# Patient Record
Sex: Male | Born: 1980 | Race: Black or African American | Hispanic: No | Marital: Single | Smoking: Never smoker
Health system: Southern US, Community
[De-identification: ages and names within clinical notes are randomized; demographics above are authoritative.]

## PROBLEM LIST (undated history)

## (undated) HISTORY — PX: HERNIA REPAIR: SHX51

## (undated) HISTORY — PX: OTHER SURGICAL HISTORY: SHX169

---

## 2005-11-13 ENCOUNTER — Ambulatory Visit (HOSPITAL_BASED_OUTPATIENT_CLINIC_OR_DEPARTMENT_OTHER): Admission: RE | Admit: 2005-11-13 | Discharge: 2005-11-13 | Payer: Self-pay | Admitting: Surgery

## 2005-12-20 ENCOUNTER — Ambulatory Visit (HOSPITAL_BASED_OUTPATIENT_CLINIC_OR_DEPARTMENT_OTHER): Admission: RE | Admit: 2005-12-20 | Discharge: 2005-12-20 | Payer: Self-pay | Admitting: Surgery

## 2006-07-28 ENCOUNTER — Encounter: Admission: RE | Admit: 2006-07-28 | Discharge: 2006-07-28 | Payer: Self-pay | Admitting: Surgery

## 2007-01-29 ENCOUNTER — Ambulatory Visit (HOSPITAL_BASED_OUTPATIENT_CLINIC_OR_DEPARTMENT_OTHER): Admission: RE | Admit: 2007-01-29 | Discharge: 2007-01-29 | Payer: Self-pay | Admitting: Surgery

## 2007-03-13 ENCOUNTER — Emergency Department (HOSPITAL_COMMUNITY): Admission: EM | Admit: 2007-03-13 | Discharge: 2007-03-13 | Payer: Self-pay | Admitting: Emergency Medicine

## 2007-05-10 ENCOUNTER — Emergency Department (HOSPITAL_COMMUNITY): Admission: EM | Admit: 2007-05-10 | Discharge: 2007-05-11 | Payer: Self-pay | Admitting: Emergency Medicine

## 2007-06-02 ENCOUNTER — Ambulatory Visit (HOSPITAL_COMMUNITY): Admission: RE | Admit: 2007-06-02 | Discharge: 2007-06-02 | Payer: Self-pay | Admitting: Surgery

## 2007-12-16 ENCOUNTER — Encounter: Payer: Self-pay | Admitting: Gastroenterology

## 2007-12-21 ENCOUNTER — Ambulatory Visit: Payer: Self-pay | Admitting: Gastroenterology

## 2007-12-21 DIAGNOSIS — K279 Peptic ulcer, site unspecified, unspecified as acute or chronic, without hemorrhage or perforation: Secondary | ICD-10-CM | POA: Insufficient documentation

## 2007-12-21 DIAGNOSIS — K210 Gastro-esophageal reflux disease with esophagitis: Secondary | ICD-10-CM

## 2007-12-21 DIAGNOSIS — R634 Abnormal weight loss: Secondary | ICD-10-CM

## 2007-12-21 DIAGNOSIS — R1033 Periumbilical pain: Secondary | ICD-10-CM | POA: Insufficient documentation

## 2007-12-23 ENCOUNTER — Ambulatory Visit: Payer: Self-pay | Admitting: Internal Medicine

## 2007-12-23 ENCOUNTER — Telehealth (INDEPENDENT_AMBULATORY_CARE_PROVIDER_SITE_OTHER): Payer: Self-pay | Admitting: *Deleted

## 2008-01-06 ENCOUNTER — Ambulatory Visit: Payer: Self-pay | Admitting: Gastroenterology

## 2008-01-08 ENCOUNTER — Ambulatory Visit: Payer: Self-pay | Admitting: Gastroenterology

## 2008-01-08 DIAGNOSIS — K3189 Other diseases of stomach and duodenum: Secondary | ICD-10-CM

## 2008-01-08 DIAGNOSIS — R1013 Epigastric pain: Secondary | ICD-10-CM

## 2008-01-08 DIAGNOSIS — R1319 Other dysphagia: Secondary | ICD-10-CM | POA: Insufficient documentation

## 2008-01-08 LAB — CONVERTED CEMR LAB
Albumin: 4.3 g/dL (ref 3.5–5.2)
Bilirubin, Direct: 0.1 mg/dL (ref 0.0–0.3)
CO2: 29 meq/L (ref 19–32)
Chloride: 101 meq/L (ref 96–112)
Creatinine, Ser: 1.3 mg/dL (ref 0.4–1.5)
Eosinophils Relative: 2.2 % (ref 0.0–5.0)
GFR calc non Af Amer: 69 mL/min
Glucose, Bld: 105 mg/dL — ABNORMAL HIGH (ref 70–99)
HCT: 49 % (ref 39.0–52.0)
MCHC: 34.6 g/dL (ref 30.0–36.0)
Monocytes Absolute: 0.6 10*3/uL (ref 0.1–1.0)
Monocytes Relative: 10.1 % (ref 3.0–12.0)
Neutrophils Relative %: 53.7 % (ref 43.0–77.0)
Platelets: 164 10*3/uL (ref 150–400)
Potassium: 4.1 meq/L (ref 3.5–5.1)
Sodium: 138 meq/L (ref 135–145)
Total Bilirubin: 1.3 mg/dL — ABNORMAL HIGH (ref 0.3–1.2)
WBC: 5.6 10*3/uL (ref 4.5–10.5)

## 2008-01-20 ENCOUNTER — Encounter: Payer: Self-pay | Admitting: Gastroenterology

## 2008-01-20 ENCOUNTER — Ambulatory Visit: Payer: Self-pay | Admitting: Gastroenterology

## 2008-01-25 ENCOUNTER — Encounter: Payer: Self-pay | Admitting: Gastroenterology

## 2010-10-09 NOTE — Op Note (Signed)
Cole [name], Cole [name]                ACCOUNT NO.:  [id number]   MEDICAL RECORD NO.:  [id number]          PATIENT TYPE:  AMB   LOCATION:  DSC                          FACILITY:  [facility]   PHYSICIAN:  Wilmon Arms. Corliss Skains, M.D. DATE OF BIRTH:  1980/12/12   DATE OF PROCEDURE:  01/29/2007  DATE OF DISCHARGE:                               OPERATIVE REPORT   PREOPERATIVE DIAGNOSIS:  Persistent periumbilical pain.   POSTOPERATIVE DIAGNOSIS:  Persistent periumbilical pain.   PROCEDURE PERFORMED:  Umbilical wound exploration.   SURGEON:  Wilmon Arms. Tsuei, M.D.,FACS   ANESTHESIA:  General.   INDICATIONS:  The patient is a 30 year old male who is status post  umbilical hernia repair December 20, 2005.  He presented recently with  complaints of persistent pain on the right side of his umbilicus.  This  is very well localized and likely represents a permanent suture holding  his mesh in place.  We offered him umbilical wound exploration with  removal of the permanent suture.   DESCRIPTION OF PROCEDURE:  The patient is brought to the operating room,  placed in the supine position on the operating table.  After an adequate  level of general anesthesia was obtained, the periumbilical region was  shaved, prepped with Betadine and draped in a sterile fashion.  We had  previously marked the area of point tenderness.  His previous incision  was infiltrated with 10 cc of 0.25% Marcaine with epinephrine.  The  incision was opened.  Cautery was used to dissect down through some of  the scar tissue down to the fascia.  We removed a total of three Novofil  sutures around the area of tenderness.  Two of these represented the  Novofil sutures holding the mesh in place on the right.  The third  suture was likely the fascial closure.  The fascial closure suture was  replaced with a 0 PDS suture.  The plan was to use an absorbable suture  which would dissolve over time.  The mesh was felt to be intact.  I did  not  make a defect large enough to pass a finger into the preperitoneal  space.  It seemed that we had removed all the sutures that we had been  looking for.  The wound was then closed with a deep layer of 3-0 Vicryl  and a subcuticular of 4-0 Monocryl.  Steri-Strips and clean dressings  were applied.  The patient was extubated and brought to recovery in  stable condition.  All sponge, instrument and needle counts were  correct.      Wilmon Arms. Tsuei, M.D.  Electronically Signed     MKT/MEDQ  D:  01/29/2007  T:  01/29/2007  Job:  [id number]

## 2010-10-09 NOTE — Op Note (Signed)
[name], Cole                ACCOUNT NO.:  [id number]   MEDICAL RECORD NO.:  [id number]          PATIENT TYPE:  AMB   LOCATION:  SDS                          FACILITY:  [facility]   PHYSICIAN:  Wilmon Arms. Corliss Skains, M.D. DATE OF BIRTH:  [birth year]   DATE OF PROCEDURE:  06/02/2007  DATE OF DISCHARGE:                               OPERATIVE REPORT   PREOPERATIVE DIAGNOSIS:  Recurrent umbilical hernia.   POSTOPERATIVE DIAGNOSIS:  Umbilical pain.   SURGEON:  Wilmon Arms. Corliss Skains, M.D., FACS   PROCEDURE:  1. Diagnostic laparoscopy.  2. Umbilical exploration.   ANESTHESIA:  General.   INDICATIONS:  The patient is a 30 year old male who is status post  umbilical hernia repair.  The patient has had a lot of problems with  periumbilical pain.  Last September he underwent open exploration with  removal of one of the Novofil sutures.  However, he continues to have  persistent pain.  A CT scan performed at [facility] showed  what appeared to be some incarcerated fat in the preperitoneal space.  It was felt that some omentum had crept around the side of his mesh.  I  offered the patient diagnostic laparoscopy to see if we could reduce  this incarcerated fat.   DESCRIPTION OF PROCEDURE:  The patient brought to the operating room,  placed in the supine position on operating table with his left arm  tucked.  After an adequate level of general anesthesia was obtained, the  patient's abdomen was shaved, prepped with Betadine and draped in  sterile fashion.  Time-out was taken assure proper patient, proper  procedure.  Below the left costal margin, we infiltrated the area with  0.25% Marcaine.  I made a 11 mm incision.  The 11-mm OptiVu trocar was  advanced into the peritoneal cavity.  We insufflated with CO2  maintaining maximal pressure of 15 mmHg.  The scope was then advanced  into the peritoneal cavity.  The patient has no adhesions to the  anterior abdominal wall.  We could visualize  the round medium Ventralex  mesh used at the prior umbilical hernia repair.  The mesh was well  deployed and was lying flat.  There was no sign of any omentum stuck to  this area.  The mesh was covered with a thick layer of peritoneum.  There did not appear to be any gaps or pleats in the mesh.  Therefore we  decided to terminate the laparoscopic portion of the case.  Pneumoperitoneum was released and the trocar was removed.  We then  anesthetized this umbilical incision with 0.25% Marcaine.  I opened the  umbilical incision.  We explored the entire wound and removed all of the  sutures.  We remove a total of four  transfascial sutures.  I could feel the layer of scar tissue around his  mesh and all of this seemed to be intact.  The wound was closed with a  deep layer of 3-0 Vicryl.  Dermabond was used to close all the skin  incisions.  The patient was then extubated, brought to recovery in  stable condition.  All sponge, instrument, needle counts correct.      Wilmon Arms. Tsuei, M.D.  Electronically Signed     MKT/MEDQ  D:  06/02/2007  T:  06/02/2007  Job:  [id number]

## 2010-10-12 NOTE — Op Note (Signed)
Cole [name], Cole [name]                ACCOUNT NO.:  [id number]   MEDICAL RECORD NO.:  [id number]          PATIENT TYPE:  AMB   LOCATION:  DSC                          FACILITY:  [facility]   PHYSICIAN:  Wilmon Arms. Corliss Skains, M.D. DATE OF BIRTH:  May 08, 1981   DATE OF PROCEDURE:  12/20/2005  DATE OF DISCHARGE:                                 OPERATIVE REPORT   PREOPERATIVE DIAGNOSIS:  Recurrent umbilical hernia.   POSTOPERATIVE DIAGNOSIS:  Recurrent umbilical hernia.   PROCEDURE PERFORMED:  Umbilical hernia repair with mesh.   SURGEON:  Wilmon Arms. Tsuei, M.D.   ANESTHESIA:  General endotracheal.   INDICATIONS:  The patient is a 30 year old male who developed an umbilical  hernia due to a lot of heavy lifting on his job.  In June 2007 he underwent  exploration of his umbilical fascia.  The patient had a tiny 2 mm defect  with herniated fat into this area.  Due to the very tiny size of the defect,  a decision was made to perform only a suture repair.  However, it appears  that the patient's fascia torn around the sutures and he has a large  umbilical hernia; therefore, the decision was made to bring him back for a  mesh repair.   DESCRIPTION OF PROCEDURE:  The patient brought to the operating room and  placed in the supine position on the operating table.  After an adequate  level of general anesthesia was obtained, the patient's abdomen was shaved,  prepped with Betadine and draped in sterile fashion.  The area around his  previous incision was anesthetized with 0.25% Marcaine with epinephrine.  A  scalpel was then used to open the incision.  Dissection was carried down  through the subcutaneous tissues and scar tissue down through the sutures.  The sutures were removed.  We were able to bluntly dissect open the hernia  defect.  The hernia defect was enlarged to allow the passage of a finger.  The hernia sac tracked up and into the posterior surface of the umbilicus.  This was reduced  completely.  Finger exploration of the posterior surface of  abdominal wall showed no other hernia defects.  A small Ventralex mesh was  then inserted into the hernia defect and the Ventralex tapes were used to  retract the mesh up to the abdominal wall.  Four interrupted 0 Novofil  sutures were used to anchor the mesh in place.  The Ventralex tapes were  then removed.  Additional figure-of-eight 0 Novofil sutures were used to  close the fascia in front of the mesh.  The subcutaneous tissues were then  irrigated.  They were then infiltrated with additional 0.25% Marcaine.  The  posterior surface of the umbilicus was tacked down with 2-0 Vicryl, 3-0  Vicryl sutures used to close the subcutaneous tissues and 4-0 Monocryl was  used to close the skin.  Steri-Strips and clean dressings were applied.  The  patient was then extubated and brought to the recovery room in stable  condition.  All sponge, instrument and needle counts were correct.  Wilmon Arms. Tsuei, M.D.  Electronically Signed    MKT/MEDQ  D:  12/20/2005  T:  12/20/2005  Job:  [id number]

## 2010-10-12 NOTE — Op Note (Signed)
NAMEISMAEL, KARGE                ACCOUNT NO.:  [id number]   MEDICAL RECORD NO.:  [id number]          PATIENT TYPE:  AMB   LOCATION:  NESC                         FACILITY:  [facility]   PHYSICIAN:  Wilmon Arms. Corliss Skains, M.D. DATE OF BIRTH:  1981-03-29   DATE OF PROCEDURE:  11/13/2005  DATE OF DISCHARGE:                                 OPERATIVE REPORT   PREOPERATIVE DIAGNOSIS:  Umbilical hernia.   POSTOPERATIVE DIAGNOSIS:  Umbilical hernia.   PROCEDURE PERFORMED:  Umbilical hernia repair.   SURGEON:  Wilmon Arms. Tsuei, M.D.   ANESTHESIA:  General via LMA.   INDICATIONS:  The patient is a healthy 30 year old male who had begun having  pain and swelling at his umbilicus.  His job involves a lot of heavy  lifting.  On examination he had a very tiny fascial defect with some  herniated fat.  We recommended elective repair.   DESCRIPTION OF PROCEDURE:  The patient was brought to the operating room,  placed in a supine position on the operating room table.  After an adequate  level of general endotracheal anesthesia was obtained, the patient's  periumbilical area was shaved, prepped with Betadine and draped in sterile  fashion.  A small transverse incision was made just above his umbilicus  after infiltrating with 0.25% Marcaine.  Dissection was carried down to the  base of the umbilicus.  A very small 3 mm fascial defect was noted.  There  was only a small amount of herniated fat in this area.  The fat appeared  viable.  The fascial defect was opened slightly to allow for exploration of  the surrounding fascia.  No other defects were palpated in the surrounding  several centimeters.  Due to the small size of defect, the decision was made  to repair this with primary closure.  Novofil 0 sutures were used in  interrupted fashion to close the fascial defect.  A total of 4 sutures were  used.  These were tied down.  The subcutaneous tissues were closed with 3-0  Vicryl and a subcuticular layer  of 4-0 Monocryl was used to close the skin.  Steri-Strips and clean dressings were applied.  The patient was extubated  and brought to the recovery room in stable condition.  All sponge,  instrument and needle counts were correct.      Wilmon Arms. Tsuei, M.D.  Electronically Signed     MKT/MEDQ  D:  11/13/2005  T:  11/13/2005  Job:  [id number]

## 2011-02-19 ENCOUNTER — Encounter (INDEPENDENT_AMBULATORY_CARE_PROVIDER_SITE_OTHER): Payer: Self-pay | Admitting: Surgery

## 2011-02-19 ENCOUNTER — Telehealth (INDEPENDENT_AMBULATORY_CARE_PROVIDER_SITE_OTHER): Payer: Self-pay | Admitting: General Surgery

## 2011-02-19 NOTE — Telephone Encounter (Signed)
PT CALLED TO SAY PAIN ALONG RIGHT SIDE OF UMBILICUS HAS RETURNED AS OF LAST WEEK AND IS WORSENING. PLEASE EVALUATE. APPT MADE WITH DR. Corliss Skains FOR 02-20-11.

## 2011-02-20 ENCOUNTER — Encounter (INDEPENDENT_AMBULATORY_CARE_PROVIDER_SITE_OTHER): Payer: Self-pay | Admitting: Surgery

## 2011-02-20 ENCOUNTER — Ambulatory Visit (INDEPENDENT_AMBULATORY_CARE_PROVIDER_SITE_OTHER): Payer: Self-pay | Admitting: Surgery

## 2011-02-20 VITALS — BP 166/92 | HR 72 | Temp 98.1°F | Resp 14 | Ht 70.0 in | Wt 185.2 lb

## 2011-02-20 DIAGNOSIS — R1033 Periumbilical pain: Secondary | ICD-10-CM

## 2011-02-20 NOTE — Patient Instructions (Signed)
We will obtain a CT scan to evaluate your abdomen to hopefully locate a source of your pain.  We will reevaluate you after the scan is complete.

## 2011-02-20 NOTE — Progress Notes (Signed)
Chief Complaint  Patient presents with  . Abdominal Pain    periumbilical    HPI Cole [name] is a 30 y.o. male who is status post multiple umbilical hernia repairs several years ago. Initially he had an open umbilical hernia repair in June of 2007 where he had a primary repair of a very small fascial defect measuring only about 3 mm. This was repaired with a zero Novafil suture. He then had a recurrence and in July of 2007 he underwent umbilical hernia repair with mesh. We used a small ventralex mesh with several stay sutures of zero Novafil. In September of 2008 the patient had severe persistent pain just to the right side of his umbilicus. I explored his wound under anesthesia and removed 3 Novafil sutures. We closed the fascia with 0 PDS. The mesh seemed to be intact and in good placement at that time. He continued to have persistent pain and in January of 2009 he underwent diagnostic laparoscopy. This showed that the mesh was in good placement, was well deployed, and was lying flat. The mesh was covered with a thick layer of peritoneum. There were no significant adhesions to the mesh. We then opened his incision and removed four more sutures. Over the last 3-1/2 years the patient has been living in [place]. He states that after some time the pain resolved temporarily. He was able to exercise without any issues. However recently he has been experiencing occasional twinges of pain withdrawal well localized to the right side of his periumbilical region. He does not notice any type of bulge. The pain is occasionally fairly severe. It does not get better when he is resting. He denies any back injury or chronic back pain. He denies any GI symptoms.HPI  No past medical history on file.  Past Surgical History  Procedure Date  . Periumbilical pain [id number]  . Hernia repair [id number]    umbilical hernia  . Hernia repair [id number]    umbilical hernia    Family History  Problem Relation Age of Onset  .  Hypertension      Social History History  Substance Use Topics  . Smoking status: Never Smoker   . Smokeless tobacco: Not on file  . Alcohol Use: No    No Known Allergies  Current Outpatient Prescriptions  Medication Sig Dispense Refill  . Naproxen Sodium (ALEVE PO) Take by mouth as needed.          Review of Systems Review of Systems ROS otherwise negative Blood pressure 166/92, pulse 72, temperature 98.1 F (36.7 C), temperature source Temporal, resp. rate 14, height 5\' 10"  (1.778 m), weight 185 lb 3.2 oz (84.006 kg).  Physical Exam Physical Exam WDWN in NAD HEENT:  EOMI, sclera anicteric Neck:  No masses, no thyromegaly Lungs:  CTA bilaterally; normal respiratory effort CV:  Regular rate and rhythm; no murmurs Abd:  +bowel sounds, soft, non-tender,  Well healed transverse supraumbilical incision.  The umbilicus does not lay flat, but it does not seem to be consistent with a recurrent hernia.  His tenderness seems to be localized to the right lower edge of his periumbilical area.  No palpable masses in this area. Ext:  Well-perfused; no edema Skin:  Warm, dry; no sign of jaundice  Data Reviewed None  Assessment    Right periumbilical pain after umbilical hernia repair in 2007.  No obvious recurrence or other etiology on physical examination    Plan    CT scan of the abdomen to evaluate for  possible recurrence.  Reevaluate after the scan.  I also recommended that he establish care with a primary care physician to monitor his blood pressure.       Cole [name] K. 02/20/2011, 12:01 PM

## 2011-02-26 ENCOUNTER — Ambulatory Visit
Admission: RE | Admit: 2011-02-26 | Discharge: 2011-02-26 | Disposition: A | Discharge status: Home / Self Care | Payer: No Typology Code available for payment source | Source: Ambulatory Visit | Attending: Surgery | Admitting: Surgery

## 2011-02-26 DIAGNOSIS — R1033 Periumbilical pain: Secondary | ICD-10-CM

## 2011-02-26 MED ORDER — IOHEXOL 300 MG/ML  SOLN
100.0000 mL | Freq: Once | INTRAMUSCULAR | Status: AC | PRN
  Administered 2011-02-26: 100 mL via INTRAVENOUS

## 2011-03-01 LAB — CBC
HCT: 51
RDW: 12.9

## 2011-03-04 ENCOUNTER — Encounter (INDEPENDENT_AMBULATORY_CARE_PROVIDER_SITE_OTHER): Payer: Self-pay | Admitting: Surgery

## 2011-03-07 ENCOUNTER — Ambulatory Visit (INDEPENDENT_AMBULATORY_CARE_PROVIDER_SITE_OTHER): Payer: Self-pay | Admitting: Surgery

## 2011-03-07 ENCOUNTER — Encounter (INDEPENDENT_AMBULATORY_CARE_PROVIDER_SITE_OTHER): Payer: Self-pay | Admitting: Surgery

## 2011-03-07 VITALS — BP 124/77 | HR 71 | Temp 98.6°F | Resp 12 | Ht 71.0 in | Wt 186.4 lb

## 2011-03-07 DIAGNOSIS — K429 Umbilical hernia without obstruction or gangrene: Secondary | ICD-10-CM | POA: Insufficient documentation

## 2011-03-07 NOTE — Progress Notes (Signed)
Chief Complaint  Patient presents with  . Follow-up    CT report   His CT scan showed a small recurrence of his umbilical hernia protruding to the right.  This corresponds with his physical examination.  We will plan an open repair of his recurrent umbilical hernia.  The surgical procedure has been discussed with the patient.  Potential risks, benefits, alternative treatments, and expected outcomes have been explained.  All of the patient's questions at this time have been answered.  The patient understand the proposed surgical procedure and wishes to proceed.  The likelihood of successful repair and return to full activity is moderate.    HPI Cole [name] is a 30 y.o. male who is status post multiple umbilical hernia repairs several years ago. Initially he had an open umbilical hernia repair in June of 2007 where he had a primary repair of a very small fascial defect measuring only about 3 mm. This was repaired with a zero Novafil suture. He then had a recurrence and in July of 2007 he underwent umbilical hernia repair with mesh. We used a small ventralex mesh with several stay sutures of zero Novafil. In September of 2008 the patient had severe persistent pain just to the right side of his umbilicus. I explored his wound under anesthesia and removed 3 Novafil sutures. We closed the fascia with 0 PDS. The mesh seemed to be intact and in good placement at that time. He continued to have persistent pain and in January of 2009 he underwent diagnostic laparoscopy. This showed that the mesh was in good placement, was well deployed, and was lying flat. The mesh was covered with a thick layer of peritoneum. There were no significant adhesions to the mesh. We then opened his incision and removed four more sutures. Over the last 3-1/2 years the patient has been living in [place]. He states that after some time the pain resolved temporarily. He was able to exercise without any issues. However recently he has been  experiencing occasional twinges of pain withdrawal well localized to the right side of his periumbilical region. He does not notice any type of bulge. The pain is occasionally fairly severe. It does not get better when he is resting. He denies any back injury or chronic back pain. He denies any GI symptoms.HPI  No past medical history on file.  Past Surgical History  Procedure Date  . Periumbilical pain [id number]  . Hernia repair [id number]    umbilical hernia  . Hernia repair [id number]    umbilical hernia    Family History  Problem Relation Age of Onset  . Hypertension      Social History History  Substance Use Topics  . Smoking status: Never Smoker   . Smokeless tobacco: Not on file  . Alcohol Use: No    No Known Allergies  Current Outpatient Prescriptions  Medication Sig Dispense Refill  . Naproxen Sodium (ALEVE PO) Take by mouth as needed.          Review of Systems Review of Systems ROS otherwise negative Blood pressure 124/77, pulse 71, temperature 98.6 F (37 C), temperature source Temporal, resp. rate 12, height 5\' 11"  (1.803 m), weight 186 lb 6.4 oz (84.55 kg).  Physical Exam Physical Exam WDWN in NAD HEENT:  EOMI, sclera anicteric Neck:  No masses, no thyromegaly Lungs:  CTA bilaterally; normal respiratory effort CV:  Regular rate and rhythm; no murmurs Abd:  +bowel sounds, soft, non-tender,  Well healed transverse supraumbilical incision.  The umbilicus  does not lay flat, but it does not seem to be consistent with a recurrent hernia.  His tenderness seems to be localized to the right lower edge of his periumbilical area.  No palpable masses in this area. Ext:  Well-perfused; no edema Skin:  Warm, dry; no sign of jaundice  Data Reviewed None  Assessment    Right periumbilical pain after umbilical hernia repair in 2007.  No obvious recurrence or other etiology on physical examination    Plan    CT scan of the abdomen to evaluate for possible  recurrence.  Reevaluate after the scan.  I also recommended that he establish care with a primary care physician to monitor his blood pressure.       Aydden Cumpian K. 03/07/2011, 11:51 AM

## 2011-03-07 NOTE — Patient Instructions (Signed)
Call our surgery schedulers at [phone] to schedule your surgery. 

## 2011-03-08 LAB — POCT HEMOGLOBIN-HEMACUE
Hemoglobin: 17
Operator id: 112821

## 2011-03-25 ENCOUNTER — Encounter (HOSPITAL_COMMUNITY): Payer: Self-pay

## 2011-03-25 ENCOUNTER — Encounter (HOSPITAL_COMMUNITY)
Admission: RE | Admit: 2011-03-25 | Discharge: 2011-03-25 | Disposition: A | Discharge status: Home / Self Care | Payer: Self-pay | Source: Ambulatory Visit | Attending: Surgery | Admitting: Surgery

## 2011-03-25 DIAGNOSIS — Z01818 Encounter for other preprocedural examination: Secondary | ICD-10-CM | POA: Insufficient documentation

## 2011-03-25 DIAGNOSIS — Z01812 Encounter for preprocedural laboratory examination: Secondary | ICD-10-CM | POA: Insufficient documentation

## 2011-03-25 LAB — CBC
HCT: 47.8 % (ref 39.0–52.0)
Hemoglobin: 17.3 g/dL — ABNORMAL HIGH (ref 13.0–17.0)
MCV: 83 fL (ref 78.0–100.0)
RDW: 13.1 % (ref 11.5–15.5)
WBC: 5.7 10*3/uL (ref 4.0–10.5)

## 2011-03-25 NOTE — Pre-Procedure Instructions (Signed)
[name]  03/25/2011   Your procedure is scheduled on:  04/02/2011  Report to [facility] at 1p.m.  Call this number if you have problems the morning of surgery: [phone]   Remember:   Do not eat food:After Midnight.  Do not drink clear liquids: 4 Hours before arrival.  Take these medicines the morning of surgery with A SIP OF WATER: none   Do not wear jewelry, make-up or nail polish.  Do not wear lotions, powders, or perfumes. You may wear deodorant.  Do not shave 48 hours prior to surgery.  Do not bring valuables to the hospital.  Contacts, dentures or bridgework may not be worn into surgery.  Leave suitcase in the car. After surgery it may be brought to your room.  For patients admitted to the hospital, checkout time is 11:00 AM the day of discharge.   Patients discharged the day of surgery will not be allowed to drive home.  Name and phone number of your driver: Chuong Casebeer ph: [phone]  Special Instructions: CHG Shower Use Special Wash: 1/2 bottle night before surgery and 1/2 bottle morning of surgery.   Please read over the following fact sheets that you were given: Pain Booklet, Coughing and Deep Breathing, Lab Information, MRSA Information and Surgical Site Infection Prevention

## 2011-03-25 NOTE — Progress Notes (Signed)
Quick Note:  This patient may proceed with surgery ______ 

## 2011-04-01 MED ORDER — CEFAZOLIN SODIUM-DEXTROSE 2-3 GM-% IV SOLR
2.0000 g | INTRAVENOUS | Status: DC
  Filled 2011-04-01: qty 50

## 2011-04-02 ENCOUNTER — Encounter (HOSPITAL_COMMUNITY): Payer: Self-pay | Admitting: Anesthesiology

## 2011-04-02 ENCOUNTER — Encounter (HOSPITAL_COMMUNITY)
Admission: RE | Disposition: A | Discharge status: Home / Self Care | Payer: Self-pay | Source: Ambulatory Visit | Attending: Surgery

## 2011-04-02 ENCOUNTER — Encounter (HOSPITAL_COMMUNITY): Payer: Self-pay

## 2011-04-02 ENCOUNTER — Ambulatory Visit (HOSPITAL_COMMUNITY)
Admission: RE | Admit: 2011-04-02 | Discharge: 2011-04-02 | Disposition: A | Discharge status: Home / Self Care | Payer: Self-pay | Source: Ambulatory Visit | Attending: Surgery | Admitting: Surgery

## 2011-04-02 ENCOUNTER — Ambulatory Visit (HOSPITAL_COMMUNITY): Payer: Self-pay | Admitting: Anesthesiology

## 2011-04-02 DIAGNOSIS — K429 Umbilical hernia without obstruction or gangrene: Secondary | ICD-10-CM

## 2011-04-02 DIAGNOSIS — Z01812 Encounter for preprocedural laboratory examination: Secondary | ICD-10-CM | POA: Insufficient documentation

## 2011-04-02 HISTORY — PX: UMBILICAL HERNIA REPAIR: SHX196

## 2011-04-02 SURGERY — REPAIR, HERNIA, UMBILICAL, ADULT
Anesthesia: General | Site: Abdomen

## 2011-04-02 MED ORDER — ONDANSETRON HCL 4 MG/2ML IJ SOLN
INTRAMUSCULAR | Status: DC | PRN
  Administered 2011-04-02: 4 mg via INTRAVENOUS

## 2011-04-02 MED ORDER — FENTANYL CITRATE 0.05 MG/ML IJ SOLN
INTRAMUSCULAR | Status: DC | PRN
  Administered 2011-04-02: 50 ug via INTRAVENOUS
  Administered 2011-04-02 (×2): 100 ug via INTRAVENOUS

## 2011-04-02 MED ORDER — MIDAZOLAM HCL 5 MG/5ML IJ SOLN
INTRAMUSCULAR | Status: DC | PRN
  Administered 2011-04-02: 2 mg via INTRAVENOUS

## 2011-04-02 MED ORDER — OXYCODONE-ACETAMINOPHEN 5-325 MG PO TABS: 1.0000 | ORAL_TABLET | ORAL | Status: DC | PRN

## 2011-04-02 MED ORDER — BUPIVACAINE LIPOSOME 1.3 % IJ SUSP
20.0000 mL | INTRAMUSCULAR | Status: DC
  Filled 2011-04-02: qty 20

## 2011-04-02 MED ORDER — SODIUM CHLORIDE 0.9 % IJ SOLN: 3.0000 mL | INTRAMUSCULAR | Status: DC | PRN

## 2011-04-02 MED ORDER — PROMETHAZINE HCL 25 MG/ML IJ SOLN: 6.2500 mg | INTRAMUSCULAR | Status: DC | PRN

## 2011-04-02 MED ORDER — MEPERIDINE HCL 25 MG/ML IJ SOLN: 6.2500 mg | INTRAMUSCULAR | Status: DC | PRN

## 2011-04-02 MED ORDER — SODIUM CHLORIDE 0.9 % IR SOLN
Status: DC | PRN
  Administered 2011-04-02: 3 mL

## 2011-04-02 MED ORDER — ONDANSETRON HCL 4 MG/2ML IJ SOLN
4.0000 mg | INTRAMUSCULAR | Status: DC | PRN
  Filled 2011-04-02: qty 2

## 2011-04-02 MED ORDER — CEFAZOLIN SODIUM 1-5 GM-% IV SOLN
INTRAVENOUS | Status: DC | PRN
  Administered 2011-04-02: 1 g via INTRAVENOUS

## 2011-04-02 MED ORDER — LACTATED RINGERS IV SOLN
INTRAVENOUS | Status: DC
  Administered 2011-04-02: 15:00:00 via INTRAVENOUS

## 2011-04-02 MED ORDER — HYDROMORPHONE HCL PF 1 MG/ML IJ SOLN
0.2500 mg | INTRAMUSCULAR | Status: DC | PRN
  Administered 2011-04-02 (×2): 0.25 mg via INTRAVENOUS

## 2011-04-02 MED ORDER — MORPHINE SULFATE 2 MG/ML IJ SOLN
2.0000 mg | INTRAMUSCULAR | Status: DC | PRN
  Filled 2011-04-02: qty 2

## 2011-04-02 MED ORDER — LACTATED RINGERS IV SOLN
INTRAVENOUS | Status: DC | PRN
  Administered 2011-04-02: 14:00:00 via INTRAVENOUS

## 2011-04-02 MED ORDER — PROPOFOL 10 MG/ML IV EMUL
INTRAVENOUS | Status: DC | PRN
  Administered 2011-04-02: 200 mg via INTRAVENOUS

## 2011-04-02 MED ORDER — OXYCODONE-ACETAMINOPHEN 5-325 MG PO TABS: 1.0000 | ORAL_TABLET | ORAL | Status: AC | PRN

## 2011-04-02 MED ORDER — BUPIVACAINE LIPOSOME 1.3 % IJ SUSP
INTRAMUSCULAR | Status: DC | PRN
  Administered 2011-04-02: 20 mL

## 2011-04-02 SURGICAL SUPPLY — 42 items
BENZOIN TINCTURE PRP APPL 2/3 (GAUZE/BANDAGES/DRESSINGS) ×2 IMPLANT
BLADE SURG 15 STRL LF DISP TIS (BLADE) ×1 IMPLANT
BLADE SURG 15 STRL SS (BLADE) ×1
BLADE SURG ROTATE 9660 (MISCELLANEOUS) IMPLANT
CANISTER SUCTION 2500CC (MISCELLANEOUS) IMPLANT
CHLORAPREP W/TINT 26ML (MISCELLANEOUS) ×2 IMPLANT
CLOSURE STERI STRIP 1/2 X4 (GAUZE/BANDAGES/DRESSINGS) ×2 IMPLANT
CLOTH BEACON ORANGE TIMEOUT ST (SAFETY) ×2 IMPLANT
COVER SURGICAL LIGHT HANDLE (MISCELLANEOUS) ×2 IMPLANT
DRAPE PED LAPAROTOMY (DRAPES) ×2 IMPLANT
DRAPE UTILITY 15X26 W/TAPE STR (DRAPE) ×4 IMPLANT
DRSG TEGADERM 4X4.75 (GAUZE/BANDAGES/DRESSINGS) ×2 IMPLANT
ELECT CAUTERY BLADE 6.4 (BLADE) ×2 IMPLANT
ELECT REM PT RETURN 9FT ADLT (ELECTROSURGICAL) ×2
ELECTRODE REM PT RTRN 9FT ADLT (ELECTROSURGICAL) ×1 IMPLANT
GAUZE SPONGE 4X4 16PLY XRAY LF (GAUZE/BANDAGES/DRESSINGS) ×2 IMPLANT
GLOVE BIO SURGEON STRL SZ7 (GLOVE) ×2 IMPLANT
GLOVE BIOGEL PI IND STRL 7.5 (GLOVE) ×1 IMPLANT
GLOVE BIOGEL PI INDICATOR 7.5 (GLOVE) ×1
GOWN STRL NON-REIN LRG LVL3 (GOWN DISPOSABLE) ×4 IMPLANT
KIT BASIN OR (CUSTOM PROCEDURE TRAY) ×2 IMPLANT
KIT ROOM TURNOVER OR (KITS) ×2 IMPLANT
NEEDLE HYPO 25GX1X1/2 BEV (NEEDLE) ×2 IMPLANT
NS IRRIG 1000ML POUR BTL (IV SOLUTION) ×2 IMPLANT
PACK SURGICAL SETUP 50X90 (CUSTOM PROCEDURE TRAY) ×2 IMPLANT
PAD ARMBOARD 7.5X6 YLW CONV (MISCELLANEOUS) ×2 IMPLANT
PENCIL BUTTON HOLSTER BLD 10FT (ELECTRODE) ×2 IMPLANT
SPECIMEN JAR SMALL (MISCELLANEOUS) IMPLANT
SPONGE GAUZE 4X4 12PLY (GAUZE/BANDAGES/DRESSINGS) ×2 IMPLANT
SPONGE LAP 18X18 X RAY DECT (DISPOSABLE) ×2 IMPLANT
STRIP CLOSURE SKIN 1/2X4 (GAUZE/BANDAGES/DRESSINGS) ×2 IMPLANT
SUT MNCRL AB 4-0 PS2 18 (SUTURE) ×2 IMPLANT
SUT NOVA NAB GS-21 0 18 T12 DT (SUTURE) ×4 IMPLANT
SUT VIC AB 3-0 SH 27 (SUTURE) ×1
SUT VIC AB 3-0 SH 27X BRD (SUTURE) ×1 IMPLANT
SYR BULB 3OZ (MISCELLANEOUS) ×2 IMPLANT
SYR CONTROL 10ML LL (SYRINGE) ×2 IMPLANT
TOWEL OR 17X24 6PK STRL BLUE (TOWEL DISPOSABLE) ×2 IMPLANT
TOWEL OR 17X26 10 PK STRL BLUE (TOWEL DISPOSABLE) ×2 IMPLANT
TUBE CONNECTING 12X1/4 (SUCTIONS) IMPLANT
WATER STERILE IRR 1000ML POUR (IV SOLUTION) IMPLANT
YANKAUER SUCT BULB TIP NO VENT (SUCTIONS) IMPLANT

## 2011-04-02 NOTE — H&P (Signed)
Chief Complaint  Patient presents with  . Follow-up    CT report   His CT scan showed a small recurrence of his umbilical hernia protruding to the right.  This corresponds with his physical examination.  We will plan an open repair of his recurrent umbilical hernia.  The surgical procedure has been discussed with the patient.  Potential risks, benefits, alternative treatments, and expected outcomes have been explained.  All of the patient's questions at this time have been answered.  The patient understand the proposed surgical procedure and wishes to proceed.  The likelihood of successful repair and return to full activity is moderate.    HPI Cole [name] is a 30 y.o. male who is status post multiple umbilical hernia repairs several years ago. Initially he had an open umbilical hernia repair in June of 2007 where he had a primary repair of a very small fascial defect measuring only about 3 mm. This was repaired with a zero Novafil suture. He then had a recurrence and in July of 2007 he underwent umbilical hernia repair with mesh. We used a small ventralex mesh with several stay sutures of zero Novafil. In September of 2008 the patient had severe persistent pain just to the right side of his umbilicus. I explored his wound under anesthesia and removed 3 Novafil sutures. We closed the fascia with 0 PDS. The mesh seemed to be intact and in good placement at that time. He continued to have persistent pain and in January of 2009 he underwent diagnostic laparoscopy. This showed that the mesh was in good placement, was well deployed, and was lying flat. The mesh was covered with a thick layer of peritoneum. There were no significant adhesions to the mesh. We then opened his incision and removed four more sutures. Over the last 3-1/2 years the patient has been living in [place]. He states that after some time the pain resolved temporarily. He was able to exercise without any issues. However recently he has been  experiencing occasional twinges of pain withdrawal well localized to the right side of his periumbilical region. He does not notice any type of bulge. The pain is occasionally fairly severe. It does not get better when he is resting. He denies any back injury or chronic back pain. He denies any GI symptoms.HPI  No past medical history on file.  Past Surgical History  Procedure Date  . Periumbilical pain [id number]  . Hernia repair [id number]    umbilical hernia  . Hernia repair [id number]    umbilical hernia    Family History  Problem Relation Age of Onset  . Hypertension      Social History History  Substance Use Topics  . Smoking status: Never Smoker   . Smokeless tobacco: Not on file  . Alcohol Use: No    No Known Allergies  Current Outpatient Prescriptions  Medication Sig Dispense Refill  . Naproxen Sodium (ALEVE PO) Take by mouth as needed.          Review of Systems Review of Systems ROS otherwise negative Blood pressure 124/77, pulse 71, temperature 98.6 F (37 C), temperature source Temporal, resp. rate 12, height 5' 11" (1.803 m), weight 186 lb 6.4 oz (84.55 kg).  Physical Exam Physical Exam WDWN in NAD HEENT:  EOMI, sclera anicteric Neck:  No masses, no thyromegaly Lungs:  CTA bilaterally; normal respiratory effort CV:  Regular rate and rhythm; no murmurs Abd:  +bowel sounds, soft, non-tender,  Well healed transverse supraumbilical incision.  The umbilicus   does not lay flat, but it does not seem to be consistent with a recurrent hernia.  His tenderness seems to be localized to the right lower edge of his periumbilical area.  No palpable masses in this area. Ext:  Well-perfused; no edema Skin:  Warm, dry; no sign of jaundice  Data Reviewed None  Assessment    Right periumbilical pain after umbilical hernia repair in 2007.  No obvious recurrence or other etiology on physical examination    Plan    CT scan of the abdomen to evaluate for possible  recurrence.  Reevaluate after the scan.  I also recommended that he establish care with a primary care physician to monitor his blood pressure.       Taima Rada K. 03/07/2011, 11:51 AM    

## 2011-04-02 NOTE — Transfer of Care (Signed)
Immediate Anesthesia Transfer of Care Note  Patient: Cole [name]  Procedure(s) Performed:  HERNIA REPAIR UMBILICAL ADULT  Patient Location: PACU  Anesthesia Type: General  Level of Consciousness: awake, alert  and oriented  Airway & Oxygen Therapy: Patient Spontanous Breathing and Patient connected to nasal cannula oxygen  Post-op Assessment: Report given to PACU RN  Post vital signs: Reviewed and stable  Complications: No apparent anesthesia complications

## 2011-04-02 NOTE — Op Note (Signed)
Indications:  The patient presented with a history of recurrent umbilical hernia.  The patient was examined and we recommended umbilical hernia repair with mesh.  Pre-operative diagnosis:  Umbilical hernia  Post-operative diagnosis:  Same  Surgeon: Tyrhonda Georgiades K.   Assistants: none  Anesthesia: General LMA anesthesia  ASA Class: 1   Procedure Details  The patient was seen again in the Holding Room. The risks, benefits, complications, treatment options, and expected outcomes were discussed with the patient. The possibilities of reaction to medication, pulmonary aspiration, perforation of viscus, bleeding, recurrent infection, the need for additional procedures, and development of a complication requiring transfusion or further operation were discussed with the patient and/or family. There was concurrence with the proposed plan, and informed consent was obtained. The site of surgery was properly noted/marked. The patient was taken to the Operating Room, identified as [name], and the procedure verified as umbilical hernia repair. A Time Out was held and the above information confirmed.  After an adequate level of general anesthesia was obtained, the patient's abdomen was prepped with Chloraprep and draped in sterile fashion.  We made a transverse incision above the umbilicus.  We dissected down through some of the previous scar tissue until we encountered a very small hernia defect just to the right of midline.  The defect measured only about 5 mm across.  We cleared the fascia in all directions. I enlarged the fascial defect slightly and explored for other fascial defects.  The fascial defect was closed with multiple interrupted figure-of-eight 1 Novofil sutures.  The base of the umbilicus was tacked down with 3-0 Vicryl.  3-0 Vicryl was used to close the subcutaneous tissues and 4-0 Monocryl was used to close the skin.  Steri-strips and clean dressing were applied.  The patient was extubated  and brought to the recovery room in stable condition.  All sponge, instrument, and needle counts were correct prior to closure and at the conclusion of the case.   Estimated Blood Loss: Minimal          Complications: None; patient tolerated the procedure well.         Disposition: PACU - hemodynamically stable.         Condition: stable

## 2011-04-02 NOTE — Anesthesia Preprocedure Evaluation (Signed)
Anesthesia Evaluation  Patient identified by MRN, date of birth, ID band Patient awake    Reviewed: Allergy & Precautions, H&P , NPO status   Airway Mallampati: I  Neck ROM: full    Dental  (+) Teeth Intact   Pulmonary neg pulmonary ROS,  clear to auscultation        Cardiovascular regular Normal    Neuro/Psych    GI/Hepatic Neg liver ROS, PUD,   Endo/Other    Renal/GU      Musculoskeletal   Abdominal   Peds  Hematology negative hematology ROS (+)   Anesthesia Other Findings   Reproductive/Obstetrics                           Anesthesia Physical Anesthesia Plan  ASA: I  Anesthesia Plan: General   Post-op Pain Management:    Induction:   Airway Management Planned:   Additional Equipment:   Intra-op Plan:   Post-operative Plan:   Informed Consent:   Dental Advisory Given  Plan Discussed with: CRNA and Surgeon  Anesthesia Plan Comments:         Anesthesia Quick Evaluation

## 2011-04-02 NOTE — Anesthesia Procedure Notes (Addendum)
Procedure Name: LMA Insertion Date/Time: 04/02/2011 2:40 PM Performed by: Rossie Muskrat Pre-anesthesia Checklist: Patient identified, Timeout performed, Emergency Drugs available, Suction available and Patient being monitored Patient Re-evaluated:Patient Re-evaluated prior to inductionOxygen Delivery Method: Circle System Utilized Preoxygenation: Pre-oxygenation with 100% oxygen Intubation Type: IV induction Ventilation: Mask ventilation without difficulty LMA: LMA inserted LMA Size: 4.0 Number of attempts: 1 Placement Confirmation: breath sounds checked- equal and bilateral and positive ETCO2 Tube secured with: Tape Dental Injury: Teeth and Oropharynx as per pre-operative assessment

## 2011-04-02 NOTE — Interval H&P Note (Signed)
History and Physical Interval Note:   04/02/2011   6:42 AM   Cole [name]  has presented today for surgery, with the diagnosis of RECURRENT UMBILICAL HERNIA  The various methods of treatment have been discussed with the patient and family. After consideration of risks, benefits and other options for treatment, the patient has consented to  Procedure(s): HERNIA REPAIR UMBILICAL ADULT as a surgical intervention .  The patients' history has been reviewed, patient examined, no change in status, stable for surgery.  I have reviewed the patients' chart and labs.  Questions were answered to the patient's satisfaction.     Wynona Luna  MD   History and Physical Interval Note:   04/02/2011   6:42 AM   Cole [name]  has presented today for surgery, with the diagnosis of RECURRENT UMBILICAL HERNIA  The various methods of treatment have been discussed with the patient and family. After consideration of risks, benefits and other options for treatment, the patient has consented to  Procedure(s): HERNIA REPAIR UMBILICAL ADULT as a surgical intervention .  The patients' history has been reviewed, patient examined, no change in status, stable for surgery.  I have reviewed the patients' chart and labs.  Questions were answered to the patient's satisfaction.    Patient reexamined, no change.   Wynona Luna  MD

## 2011-04-02 NOTE — Anesthesia Postprocedure Evaluation (Signed)
  Anesthesia Post-op Note  Patient: Cole [name]  Procedure(s) Performed:  HERNIA REPAIR UMBILICAL ADULT  Patient Location: PACU  Anesthesia Type: General  Level of Consciousness: awake  Airway and Oxygen Therapy: Patient Spontanous Breathing  Post-op Pain: mild  Post-op Assessment: Post-op Vital signs reviewed and Patient's Cardiovascular Status Stable  Post-op Vital Signs: stable  Complications: No apparent anesthesia complications

## 2011-04-02 NOTE — Preoperative (Signed)
Beta Blockers   Reason not to administer Beta Blockers:Not Applicable 

## 2011-04-08 ENCOUNTER — Encounter (INDEPENDENT_AMBULATORY_CARE_PROVIDER_SITE_OTHER): Payer: Self-pay | Admitting: Surgery

## 2011-04-08 ENCOUNTER — Encounter (HOSPITAL_COMMUNITY): Payer: Self-pay | Admitting: Surgery

## 2011-04-23 ENCOUNTER — Encounter (INDEPENDENT_AMBULATORY_CARE_PROVIDER_SITE_OTHER): Payer: Self-pay | Admitting: Surgery

## 2011-04-23 ENCOUNTER — Ambulatory Visit (INDEPENDENT_AMBULATORY_CARE_PROVIDER_SITE_OTHER): Payer: Self-pay | Admitting: Surgery

## 2011-04-23 VITALS — BP 124/75 | HR 78 | Temp 98.4°F | Resp 14 | Ht 71.0 in | Wt 188.2 lb

## 2011-04-23 DIAGNOSIS — K429 Umbilical hernia without obstruction or gangrene: Secondary | ICD-10-CM

## 2011-04-23 NOTE — Progress Notes (Signed)
S/p open repair of small recurrent ventral hernia on 04/02/11.  He had a tiny defect just to the right of midline which was repaired with suture.  He is doing well with minimal soreness.  The incision is well-healed with no sign of infection.  No sign of recurrent hernia.    He may slowly resume full activity over the next 3 weeks.  Follow-up PRN

## 2011-04-23 NOTE — Patient Instructions (Signed)
You may resume full activity in 3 weeks.

## 2011-05-03 ENCOUNTER — Encounter (INDEPENDENT_AMBULATORY_CARE_PROVIDER_SITE_OTHER): Payer: Self-pay | Admitting: Surgery

## 2011-05-03 ENCOUNTER — Ambulatory Visit (INDEPENDENT_AMBULATORY_CARE_PROVIDER_SITE_OTHER): Payer: Self-pay | Admitting: Surgery

## 2011-05-03 VITALS — BP 130/68 | HR 80 | Temp 97.4°F | Resp 18 | Ht 71.0 in | Wt 186.0 lb

## 2011-05-03 DIAGNOSIS — K429 Umbilical hernia without obstruction or gangrene: Secondary | ICD-10-CM

## 2011-05-03 NOTE — Progress Notes (Signed)
The patient had been doing quite well, but over the last few days has noticed some persistent pain just inferior and to the right of his umbilicus.  Given his history, he is quite concerned about this and comes to the office today for evaluation.    His incision is well-healed.  His umbilicus is flat and shows no sign of swelling or recurrent hernia.  No movement with Valsalva maneuver.    I think he still has slight inflammation from healing and possibly some regenerating sensory nerves.  I do not see any current problems with his hernia repair.  I will reevaluate him in 2-3 weeks.  Cole [name]. Cole Skains, MD, [facility]  05/03/2011 3:46 PM

## 2011-05-30 ENCOUNTER — Encounter (INDEPENDENT_AMBULATORY_CARE_PROVIDER_SITE_OTHER): Payer: Self-pay | Admitting: Surgery

## 2011-05-30 ENCOUNTER — Ambulatory Visit (INDEPENDENT_AMBULATORY_CARE_PROVIDER_SITE_OTHER): Payer: Self-pay | Admitting: Surgery

## 2011-05-30 VITALS — BP 122/76 | HR 72 | Temp 98.1°F | Resp 16 | Ht 70.0 in | Wt 185.2 lb

## 2011-05-30 DIAGNOSIS — K429 Umbilical hernia without obstruction or gangrene: Secondary | ICD-10-CM

## 2011-05-30 NOTE — Progress Notes (Signed)
Feeling better.  Minimal soreness.  No sign of recurrent hernia.  No swelling noted.  Resume full activity.  Follow-up PRN  Wilmon Arms. Corliss Skains, MD, [facility]  05/30/2011 9:10 AM

## 2012-12-22 IMAGING — CT CT ABDOMEN W/ CM
2 of 4 series · 11 of 36 positions shown, 18 images · IV contrast (READICAT/WATER & [ID] OMNI 300)
Comparison: CT abdomen pelvis of 12/23/2007

CLINICAL DATA: Umbilical hernia repair in 2776, now with
periumbilical pain

CT ABDOMEN WITH CONTRAST
TECHNIQUE: Multidetector CT imaging of the abdomen was performed
using the standard protocol following bolus administration of
intravenous contrast.
Contrast: 100 ml [id number]

[Series 3: abd/pelvis with · axial · 0.70mm/px · z∈[-473,-113]mm · 10 of 88 slices shown, 16 images]
[im 8/88  soft-tissue]
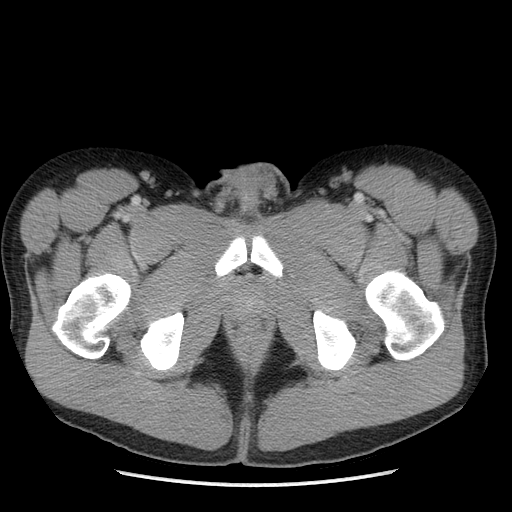
[im 8/88  bone]
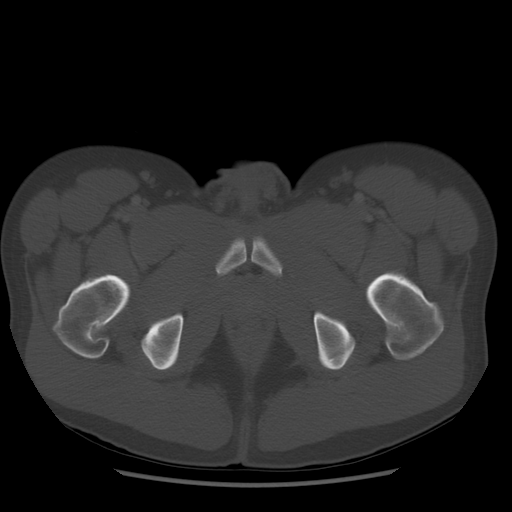
[im 16/88  soft-tissue]
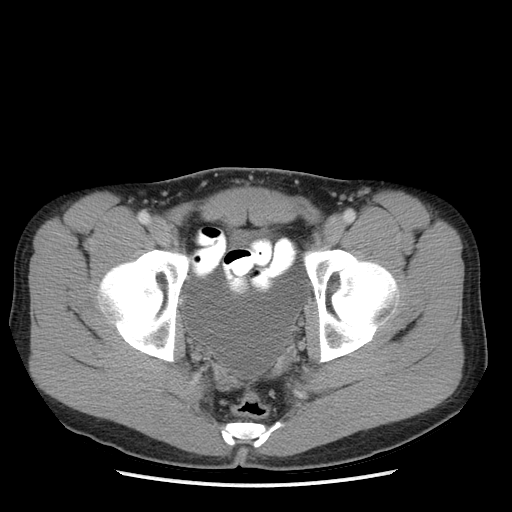
[im 24/88  soft-tissue]
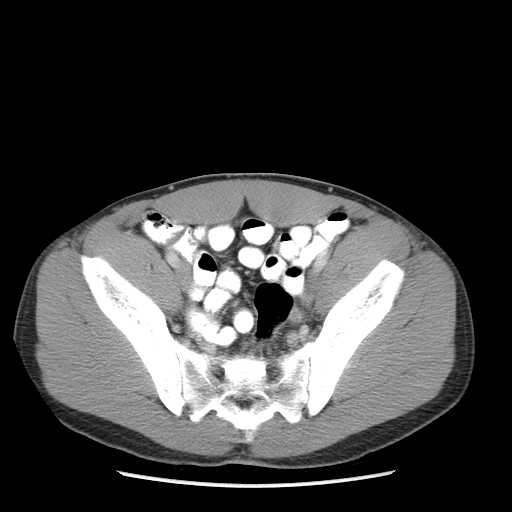
[im 32/88  soft-tissue]
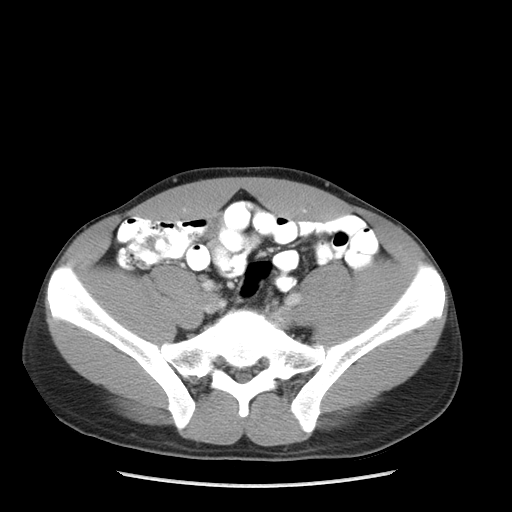
[im 40/88  soft-tissue]
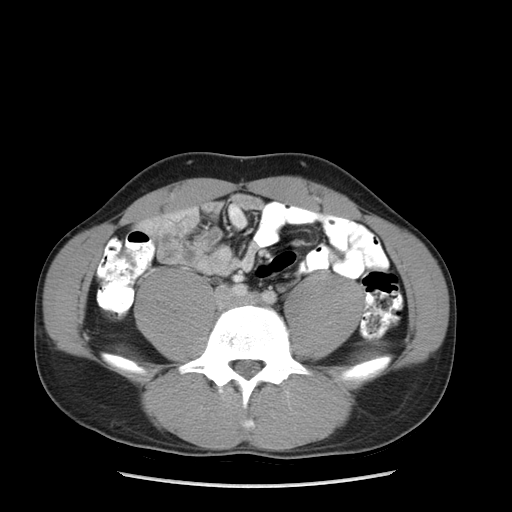
[im 48/88  soft-tissue]
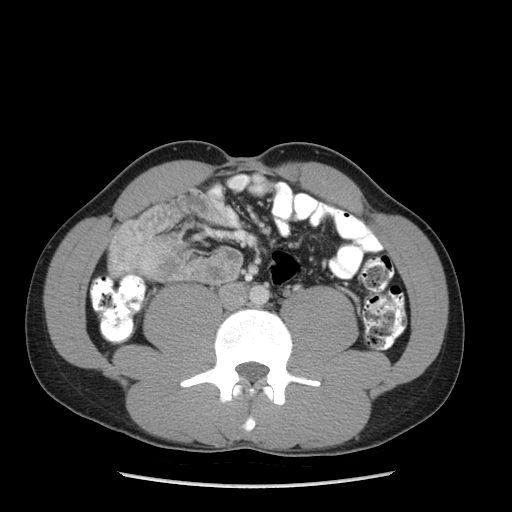
[im 56/88  soft-tissue]
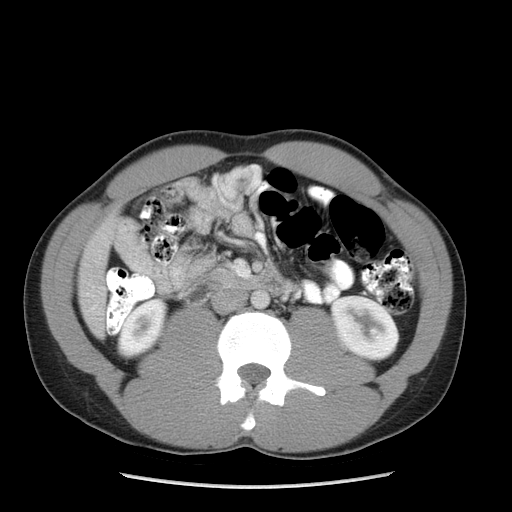
[im 56/88  lung]
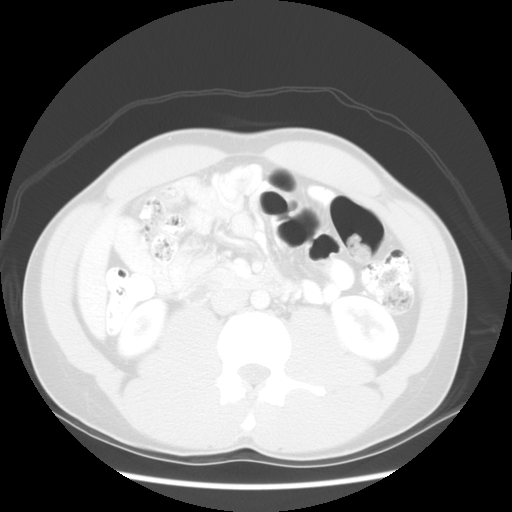
[im 64/88  soft-tissue]
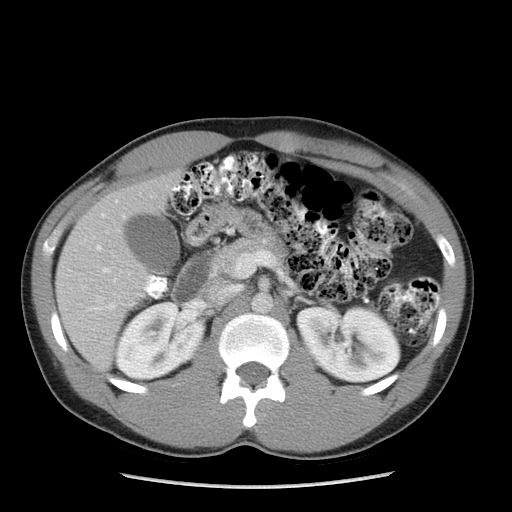
[im 64/88  lung]
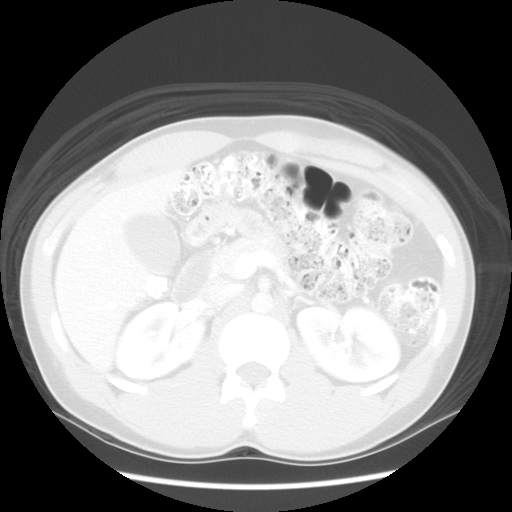
[im 72/88  soft-tissue]
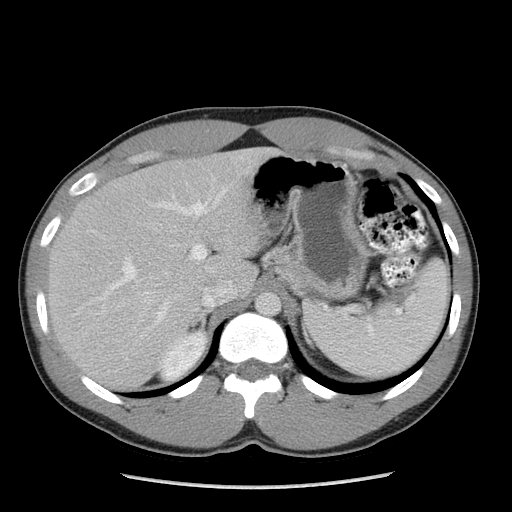
[im 72/88  lung]
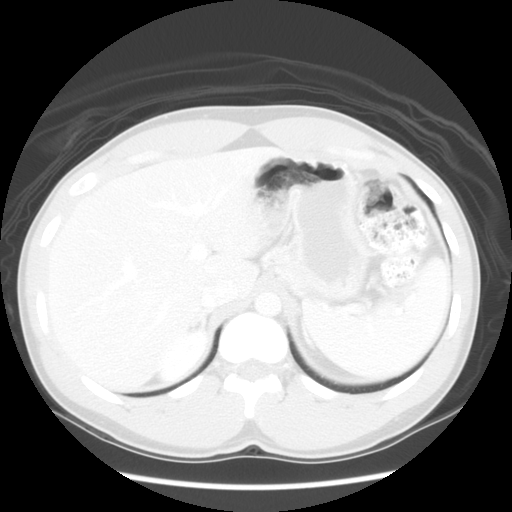
[im 72/88  bone]
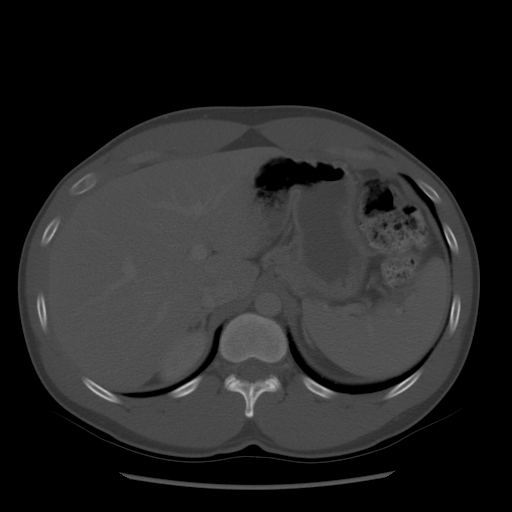
[im 80/88  soft-tissue]
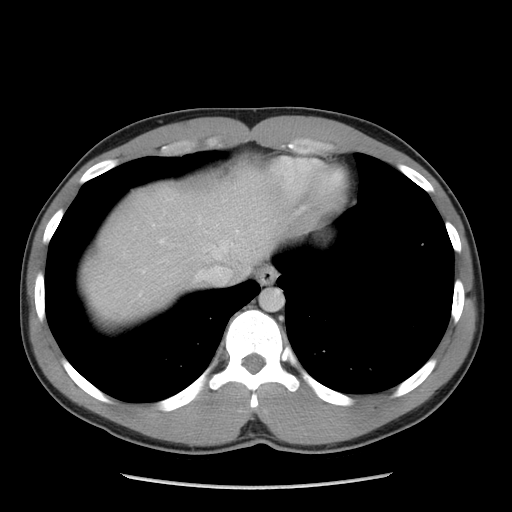
[im 80/88  lung]
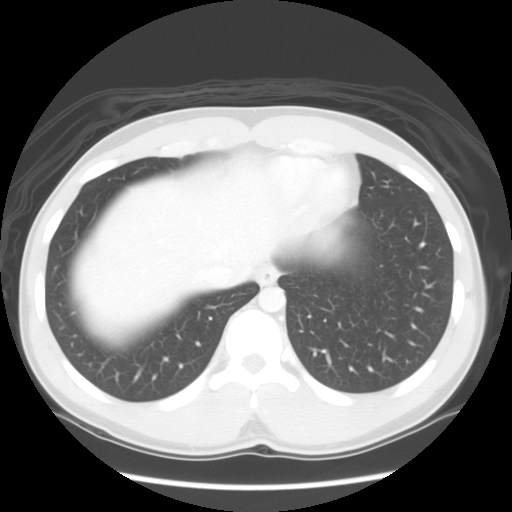

[Series 601: coronal body · coronal · 0.90mm/px · 1 of 110 slices shown, 2 images]
[im 37/110  soft-tissue]
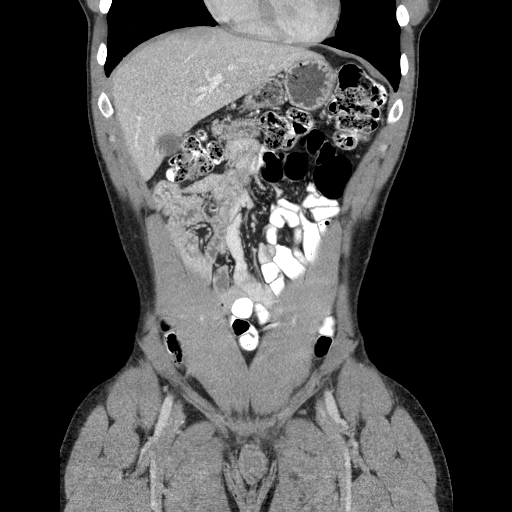
[im 37/110  bone]
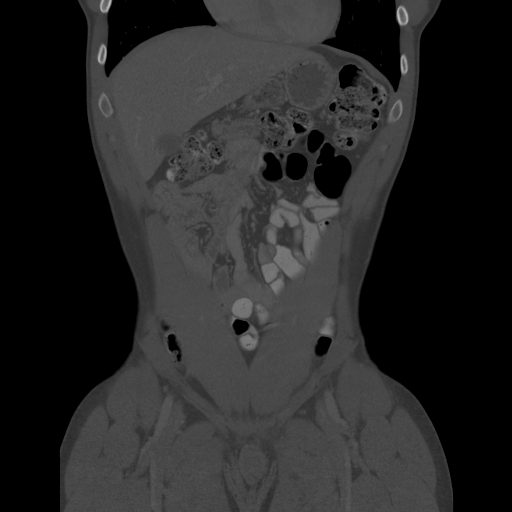

[11 of 36 positions shown; findings below may reference images not displayed]

FINDINGS: The lung bases are clear.  The liver enhances with no
focal abnormality and no ductal dilatation is seen.  No calcified
gallstones are noted.  The pancreas is normal in size and the
pancreatic duct is not dilated.  The adrenal glands and spleen are
unremarkable.  The stomach is partially fluid distended with no
abnormality noted.  The kidneys enhance with no calculus or mass
and no hydronephrosis is seen.  The abdominal aorta is normal in
caliber.  The origins of the celiac axis, SMA, renal arteries and
IMA are patent.

There is a small right periumbilical hernia containing only fat.
Some subcutaneous strandiness periumbilical is most likely due to
scarring from prior surgery.  The urinary bladder is unremarkable.
The prostate is normal in size.  No pelvic mass or fluid is seen.
The terminal ileum is unremarkable, as is the appendix.  No bony
abnormality is seen.
IMPRESSION: Only a tiny right periumbilical hernia is present containing fat.

## 2014-09-29 ENCOUNTER — Encounter: Payer: Self-pay | Admitting: Gastroenterology

## 2017-03-01 ENCOUNTER — Encounter: Payer: Self-pay | Admitting: Physician Assistant

## 2017-03-01 ENCOUNTER — Ambulatory Visit (INDEPENDENT_AMBULATORY_CARE_PROVIDER_SITE_OTHER): Payer: BLUE CROSS/BLUE SHIELD | Admitting: Physician Assistant

## 2017-03-01 VITALS — BP 118/68 | HR 100 | Temp 98.7°F | Resp 18 | Ht 70.71 in | Wt 194.4 lb

## 2017-03-01 DIAGNOSIS — K4090 Unilateral inguinal hernia, without obstruction or gangrene, not specified as recurrent: Secondary | ICD-10-CM

## 2017-03-01 NOTE — Progress Notes (Signed)
Cole [name]  MRN: [id number] DOB: 1980-08-31  PCP: Patient, No Pcp Per  Chief Complaint  Patient presents with  . Groin Swelling    X 2 weeks    Subjective:  Pt presents to clinic for swelling in his pubic area that he noticed last week.  It does not hurt and it has not changed size since he 1st noticed.  The skin at that area is normal appearing to him.  He has done nothing for it.  He lifts for work but has no pain with lifting - maybe some pressure with lifting.  The patient is very upset as he states that he has never been seen in a [facility] facility in the past.  He has never had surgery.  His DOB, address and multiple other identifies were correctly identified in the chart.  History is obtained by patient.  Review of Systems  Constitutional: Negative for chills and fever.  Gastrointestinal: Negative.     Patient Active Problem List   Diagnosis Date Noted  . Recurrent umbilical hernia 03/07/2011  . DYSPEPSIA 01/08/2008  . DYSPHAGIA 01/08/2008  . ESOPHAGITIS, REFLUX 12/21/2007  . Loss of weight 12/21/2007  . ABDOMINAL PAIN, PERIUMBILICAL 12/21/2007  . PEPTIC ULCER DISEASE, HELICOBACTER PYLORI POSITIVE 12/21/2007    No current outpatient prescriptions on file prior to visit.   No current facility-administered medications on file prior to visit.     Allergies  Allergen Reactions  . No Known Drug Allergy     History reviewed. No pertinent past medical history. Social History   Social History Narrative  . No narrative on file   Social History  Substance Use Topics  . Smoking status: Never Smoker  . Smokeless tobacco: Never Used  . Alcohol use No   family history includes Hypertension in his unknown relative.     Objective:  BP 118/68   Pulse 100   Temp 98.7 F (37.1 C) (Oral)   Resp 18   Ht 5' 10.71" (1.796 m)   Wt 194 lb 6.4 oz (88.2 kg)   SpO2 100%   BMI 27.34 kg/m  Body mass index is 27.34 kg/m.  Physical Exam  Constitutional: He is oriented  to person, place, and time and well-developed, well-nourished, and in no distress.  HENT:  Head: Normocephalic and atraumatic.  Right Ear: External ear normal.  Left Ear: External ear normal.  Eyes: Conjunctivae are normal.  Neck: Normal range of motion.  Cardiovascular: Normal rate, regular rhythm and normal heart sounds.   No murmur heard. Pulmonary/Chest: Effort normal and breath sounds normal. He has no wheezes.  Abdominal: A hernia is present.    Neurological: He is alert and oriented to person, place, and time. Gait normal.  Skin: Skin is warm and dry.  Psychiatric: Mood, memory, affect and judgment normal.    Assessment and Plan :  Non-recurrent unilateral inguinal hernia without obstruction or gangrene - Plan: Ambulatory referral to General Surgery, Care order/instruction:   Pt has a direct right hernia - we will send for surgical evaluation for treatment options.  We discussed that if he starts to have pain he will seek immediate medical care.  If the bulge does not go away spontaneously he can gently try to push back if he is unable he will seek medication attention.  Pt encouraged to call patient care at [facility] to discussed information that is in his chart that he does not believe is his medical information.  Benny Lennert PA-C  Primary Care  at [facility] 03/01/2017 4:28 PM

## 2017-03-01 NOTE — Patient Instructions (Addendum)
[phone] - patient concerns -    IF you received an x-ray today, you will receive an invoice from [facility]. Please contact [facility] at [phone] with questions or concerns regarding your invoice.   IF you received labwork today, you will receive an invoice from [facility]. Please contact [facility] at [phone] with questions or concerns regarding your invoice.   Our billing staff will not be able to assist you with questions regarding bills from these companies.  You will be contacted with the lab results as soon as they are available. The fastest way to get your results is to activate your My Chart account. Instructions are located on the last page of this paperwork. If you have not heard from [place] regarding the results in 2 weeks, please contact this office.    Hernia, Adult A hernia is the bulging of an organ or tissue through a weak spot in the muscles of the abdomen (abdominal wall). Hernias develop most often near the navel or groin. There are many kinds of hernias. Common kinds include:  Femoral hernia. This kind of hernia develops under the groin in the upper thigh area.  Inguinal hernia. This kind of hernia develops in the groin or scrotum.  Umbilical hernia. This kind of hernia develops near the navel.  Hiatal hernia. This kind of hernia causes part of the stomach to be pushed up into the chest.  Incisional hernia. This kind of hernia bulges through a scar from an abdominal surgery.  What are the causes? This condition may be caused by:  Heavy lifting.  Coughing over a long period of time.  Straining to have a bowel movement.  An incision made during an abdominal surgery.  A birth defect (congenital defect).  Excess weight or obesity.  Smoking.  Poor nutrition.  Cystic fibrosis.  Excess fluid in the abdomen.  Undescended testicles.  What are the signs or symptoms? Symptoms of a hernia include:  A lump on the abdomen. This  is the first sign of a hernia. The lump may become more obvious with standing, straining, or coughing. It may get bigger over time if it is not treated or if the condition causing it is not treated.  Pain. A hernia is usually painless, but it may become painful over time if treatment is delayed. The pain is usually dull and may get worse with standing or lifting heavy objects.  Sometimes a hernia gets tightly squeezed in the weak spot (strangulated) or stuck there (incarcerated) and causes additional symptoms. These symptoms may include:  Vomiting.  Nausea.  Constipation.  Irritability.  How is this diagnosed? A hernia may be diagnosed with:  A physical exam. During the exam your health care provider may ask you to cough or to make a specific movement, because a hernia is usually more visible when you move.  Imaging tests. These can include: ? X-rays. ? Ultrasound. ? CT scan.  How is this treated? A hernia that is small and painless may not need to be treated. A hernia that is large or painful may be treated with surgery. Inguinal hernias may be treated with surgery to prevent incarceration or strangulation. Strangulated hernias are always treated with surgery, because lack of blood to the trapped organ or tissue can cause it to die. Surgery to treat a hernia involves pushing the bulge back into place and repairing the weak part of the abdomen. Follow these instructions at home:  Avoid straining.  Do not lift anything heavier than 10 lb (  4.5 kg).  Lift with your leg muscles, not your back muscles. This helps avoid strain.  When coughing, try to cough gently.  Prevent constipation. Constipation leads to straining with bowel movements, which can make a hernia worse or cause a hernia repair to break down. You can prevent constipation by: ? Eating a high-fiber diet that includes plenty of fruits and vegetables. ? Drinking enough fluids to keep your urine clear or pale yellow. Aim  to drink 6-8 glasses of water per day. ? Using a stool softener as directed by your health care provider.  Lose weight, if you are overweight.  Do not use any tobacco products, including cigarettes, chewing tobacco, or electronic cigarettes. If you need help quitting, ask your health care provider.  Keep all follow-up visits as directed by your health care provider. This is important. Your health care provider may need to monitor your condition. Contact a health care provider if:  You have swelling, redness, and pain in the affected area.  Your bowel habits change. Get help right away if:  You have a fever.  You have abdominal pain that is getting worse.  You feel nauseous or you vomit.  You cannot push the hernia back in place by gently pressing on it while you are lying down.  The hernia: ? Changes in shape or size. ? Is stuck outside the abdomen. ? Becomes discolored. ? Feels hard or tender. This information is not intended to replace advice given to you by your health care provider. Make sure you discuss any questions you have with your health care provider. Document Released: 05/13/2005 Document Revised: 10/11/2015 Document Reviewed: 03/23/2014 Elsevier Interactive Patient Education  2017 [employer].

## 2019-01-25 ENCOUNTER — Encounter (HOSPITAL_COMMUNITY): Payer: Self-pay

## 2019-01-25 ENCOUNTER — Other Ambulatory Visit: Payer: Self-pay

## 2019-01-25 ENCOUNTER — Ambulatory Visit (HOSPITAL_COMMUNITY)
Admission: EM | Admit: 2019-01-25 | Discharge: 2019-01-25 | Disposition: A | Discharge status: Home / Self Care | Payer: BC Managed Care – PPO | Attending: Family Medicine | Admitting: Family Medicine

## 2019-01-25 DIAGNOSIS — K219 Gastro-esophageal reflux disease without esophagitis: Secondary | ICD-10-CM | POA: Diagnosis not present

## 2019-01-25 DIAGNOSIS — R079 Chest pain, unspecified: Secondary | ICD-10-CM

## 2019-01-25 MED ORDER — OMEPRAZOLE 20 MG PO CPDR: 20.0000 mg | DELAYED_RELEASE_CAPSULE | Freq: Every day | ORAL | 1 refills | Status: AC

## 2019-01-25 NOTE — ED Triage Notes (Signed)
Pt states she has abdominal pain and chest discomfort x 1 week and it's been off and on. Pt states he took a Prilosec and it helped.

## 2019-01-25 NOTE — Discharge Instructions (Addendum)
I believe your symptoms are associated with acid reflux.  We are going to try omeprazole 20 mg once daily. You can take this twice a day if needed.  Make sure that you take the omeprazole 30- 60 minutes prior to a meal with a glass of water.  Avoid spicy, greasy foods, caffeine, chocolate and milk products.  No eating 2-3 hours before bedtime. Elevate the head of the bed 30 degrees.  Try this for a few weeks to see if this improves your symptoms.  If you don't see any improvement or your symptoms worsen please follow up with a GI

## 2019-01-25 NOTE — ED Provider Notes (Addendum)
[facility]    CSN: [id number] Arrival date & time: 01/25/19  1005      History   Chief Complaint Chief Complaint  Patient presents with  . Abdominal Pain    HPI Jaquori Hunsinger is a 38 y.o. male.   Patient is a 38 year old male with past medical history of hernia, dyspepsia, GERD, ulcer.  He presents today with approximately 1 week of intermittent upper abdominal discomfort, chest discomfort, bloating and gas.  Reporting history of same in the past with GERD.  Took Prilosec this morning and is currently asymptomatic.  Reporting he has had increase in caffeine use drinking tea and Pepsi frequently.  He is contributing this to his symptoms.  He had similar episode approximately 2 to 3 years ago with increase in caffeine use.  Denies any cough, chest congestion or shortness of breath.  He does not smoke.  No history of PE, DVT.  Denies any heart palpitations.  No recent traveling or sick contacts.  No COVID exposures.  No fever, chills, body aches, night sweats.  ROS per HPI      History reviewed. No pertinent past medical history.  Patient Active Problem List   Diagnosis Date Noted  . Recurrent umbilical hernia 03/07/2011  . DYSPEPSIA 01/08/2008  . DYSPHAGIA 01/08/2008  . ESOPHAGITIS, REFLUX 12/21/2007  . Loss of weight 12/21/2007  . ABDOMINAL PAIN, PERIUMBILICAL 12/21/2007  . PEPTIC ULCER DISEASE, HELICOBACTER PYLORI POSITIVE 12/21/2007    Past Surgical History:  Procedure Laterality Date  . HERNIA REPAIR  [id number]   umbilical hernia  . HERNIA REPAIR  [id number]   umbilical hernia  . HERNIA REPAIR  multiple hernia repair  . periumbilical pain  [id number]  . UMBILICAL HERNIA REPAIR  04/02/2011   Procedure: HERNIA REPAIR UMBILICAL ADULT;  Surgeon: Wilmon Arms. Corliss Skains, MD;  Location: [facility] OR;  Service: General;  Laterality: N/A;       Home Medications    Prior to Admission medications   Medication Sig Start Date End Date Taking? Authorizing Provider  omeprazole  (PRILOSEC) 20 MG capsule Take 1 capsule (20 mg total) by mouth daily. 01/25/19   Janace Aris, NP    Family History Family History  Problem Relation Age of Onset  . Hypertension Other   . Anesthesia problems Neg Hx   . Hypotension Neg Hx   . Malignant hyperthermia Neg Hx   . Pseudochol deficiency Neg Hx     Social History Social History   Tobacco Use  . Smoking status: Never Smoker  . Smokeless tobacco: Never Used  Substance Use Topics  . Alcohol use: No  . Drug use: No     Allergies   No known drug allergy   Review of Systems Review of Systems   Physical Exam Triage Vital Signs ED Triage Vitals  Enc Vitals Group     BP 01/25/19 1058 (!) 163/93     Pulse Rate 01/25/19 1058 87     Resp 01/25/19 1058 18     Temp 01/25/19 1058 98.4 F (36.9 C)     Temp Source 01/25/19 1058 Oral     SpO2 01/25/19 1058 100 %     Weight 01/25/19 1055 190 lb (86.2 kg)     Height --      Head Circumference --      Peak Flow --      Pain Score 01/25/19 1054 3     Pain Loc --      Pain Edu? --  Excl. in GC? --    No data found.  Updated Vital Signs BP (!) 163/93 (BP Location: Right Arm)   Pulse 87   Temp 98.4 F (36.9 C) (Oral)   Resp 18   Wt 190 lb (86.2 kg)   SpO2 100%   BMI 26.72 kg/m   Visual Acuity Right Eye Distance:   Left Eye Distance:   Bilateral Distance:    Right Eye Near:   Left Eye Near:    Bilateral Near:     Physical Exam Vitals signs and nursing note reviewed.  Constitutional:      General: He is not in acute distress.    Appearance: He is well-developed. He is not ill-appearing, toxic-appearing or diaphoretic.  HENT:     Head: Normocephalic and atraumatic.  Cardiovascular:     Rate and Rhythm: Normal rate and regular rhythm.     Heart sounds: Normal heart sounds.  Pulmonary:     Effort: Pulmonary effort is normal.     Breath sounds: Normal breath sounds.  Abdominal:     General: Abdomen is flat. Bowel sounds are normal. There is no  distension or abdominal bruit.     Palpations: Abdomen is soft. There is no shifting dullness, fluid wave, hepatomegaly, splenomegaly, mass or pulsatile mass.     Hernia: No hernia is present.  Genitourinary:    Scrotum/Testes:        Right: Tenderness or swelling not present.        Left: Tenderness or swelling not present.  Skin:    General: Skin is warm and dry.  Neurological:     Mental Status: He is alert.  Psychiatric:        Mood and Affect: Mood normal.      UC Treatments / Results  Labs (all labs ordered are listed, but only abnormal results are displayed) Labs Reviewed - No data to display  EKG   Radiology No results found.  Procedures Procedures (including critical care time)  Medications Ordered in UC Medications - No data to display  Initial Impression / Assessment and Plan / UC Course  I have reviewed the triage vital signs and the nursing notes.  Pertinent labs & imaging results that were available during my care of the patient were reviewed by me and considered in my medical decision making (see chart for details).     Patient is a 38 year old male with no significant past medical history besides GERD.  Presenting today with what appears to be GERD type symptoms.  Upper abdominal, epigastric discomfort with mild chest discomfort.  Relieved with omeprazole that he took this a.m.  Currently asymptomatic Vital signs stable and he is nontoxic or ill-appearing.  No acute abdomen on exam. EKG with no acute changes and no EKG to compare.  No concern for ACS or PE at this time.  Spoke over case and EKG with Dr. Mannie Stabile and agreed nothing cute to send to the ER at this time.  We will treat this as GERD and have him take the omeprazole daily, or twice a day if needed and cut back on the caffeine.  Strict return precautions if symptoms continue or worsen he would need to go the ER for further work-up.  Otherwise contact given for primary care follow-up, GI and  cardiology as needed.  Final Clinical Impressions(s) / UC Diagnoses   Final diagnoses:  Gastroesophageal reflux disease without esophagitis  Chest pain, unspecified type     Discharge Instructions  I believe your symptoms are associated with acid reflux.  We are going to try omeprazole 20 mg once daily. You can take this twice a day if needed.  Make sure that you take the omeprazole 30- 60 minutes prior to a meal with a glass of water.  Avoid spicy, greasy foods, caffeine, chocolate and milk products.  No eating 2-3 hours before bedtime. Elevate the head of the bed 30 degrees.  Try this for a few weeks to see if this improves your symptoms.  If you don't see any improvement or your symptoms worsen please follow up with a GI      ED Prescriptions    Medication Sig Dispense Auth. Provider   omeprazole (PRILOSEC) 20 MG capsule Take 1 capsule (20 mg total) by mouth daily. 30 capsule Dahlia ByesBast, Yuriana Gaal A, NP     Controlled Substance Prescriptions [place] Controlled Substance Registry consulted? Not Applicable        Janace ArisBast, Joelle [place] A, NP 01/25/19 1323
